# Patient Record
Sex: Male | Born: 1970 | Race: White | Hispanic: No | Marital: Single | Smoking: Never smoker
Health system: Southern US, Community
[De-identification: ages and names within clinical notes are randomized; demographics above are authoritative.]

## PROBLEM LIST (undated history)

## (undated) DIAGNOSIS — M1990 Unspecified osteoarthritis, unspecified site: Secondary | ICD-10-CM

## (undated) DIAGNOSIS — N200 Calculus of kidney: Secondary | ICD-10-CM

---

## 1999-03-30 ENCOUNTER — Ambulatory Visit (HOSPITAL_COMMUNITY): Admission: RE | Admit: 1999-03-30 | Discharge: 1999-03-30 | Payer: Self-pay | Admitting: Family Medicine

## 1999-03-30 ENCOUNTER — Encounter: Payer: Self-pay | Admitting: Family Medicine

## 1999-09-23 ENCOUNTER — Encounter: Admission: RE | Admit: 1999-09-23 | Discharge: 1999-09-23 | Payer: Self-pay | Admitting: Family Medicine

## 1999-09-23 ENCOUNTER — Encounter: Payer: Self-pay | Admitting: Family Medicine

## 2002-09-11 ENCOUNTER — Encounter: Admission: RE | Admit: 2002-09-11 | Discharge: 2002-09-11 | Payer: Self-pay | Admitting: *Deleted

## 2002-09-11 ENCOUNTER — Encounter: Payer: Self-pay | Admitting: *Deleted

## 2002-09-20 ENCOUNTER — Encounter: Admission: RE | Admit: 2002-09-20 | Discharge: 2002-09-20 | Payer: Self-pay | Admitting: *Deleted

## 2002-09-20 ENCOUNTER — Encounter: Payer: Self-pay | Admitting: *Deleted

## 2003-04-12 ENCOUNTER — Encounter: Payer: Self-pay | Admitting: Orthopedic Surgery

## 2003-04-12 ENCOUNTER — Encounter: Admission: RE | Admit: 2003-04-12 | Discharge: 2003-04-12 | Payer: Self-pay | Admitting: Orthopedic Surgery

## 2003-04-24 ENCOUNTER — Encounter: Payer: Self-pay | Admitting: Orthopedic Surgery

## 2003-04-24 ENCOUNTER — Encounter: Admission: RE | Admit: 2003-04-24 | Discharge: 2003-04-24 | Payer: Self-pay | Admitting: Orthopedic Surgery

## 2010-10-28 ENCOUNTER — Encounter
Admission: RE | Admit: 2010-10-28 | Discharge: 2010-10-28 | Payer: Self-pay | Source: Home / Self Care | Attending: Family Medicine | Admitting: Family Medicine

## 2013-05-11 ENCOUNTER — Encounter (HOSPITAL_COMMUNITY): Payer: Self-pay | Admitting: Emergency Medicine

## 2013-05-11 ENCOUNTER — Emergency Department (HOSPITAL_COMMUNITY): Payer: 59

## 2013-05-11 ENCOUNTER — Emergency Department (HOSPITAL_COMMUNITY)
Admission: EM | Admit: 2013-05-11 | Discharge: 2013-05-11 | Disposition: A | Discharge status: Home / Self Care | Payer: 59 | Attending: Emergency Medicine | Admitting: Emergency Medicine

## 2013-05-11 DIAGNOSIS — Z87442 Personal history of urinary calculi: Secondary | ICD-10-CM | POA: Insufficient documentation

## 2013-05-11 DIAGNOSIS — R3915 Urgency of urination: Secondary | ICD-10-CM | POA: Insufficient documentation

## 2013-05-11 DIAGNOSIS — Z8739 Personal history of other diseases of the musculoskeletal system and connective tissue: Secondary | ICD-10-CM | POA: Insufficient documentation

## 2013-05-11 DIAGNOSIS — I880 Nonspecific mesenteric lymphadenitis: Secondary | ICD-10-CM | POA: Insufficient documentation

## 2013-05-11 DIAGNOSIS — R509 Fever, unspecified: Secondary | ICD-10-CM | POA: Insufficient documentation

## 2013-05-11 DIAGNOSIS — N390 Urinary tract infection, site not specified: Secondary | ICD-10-CM | POA: Insufficient documentation

## 2013-05-11 HISTORY — DX: Calculus of kidney: N20.0

## 2013-05-11 HISTORY — DX: Unspecified osteoarthritis, unspecified site: M19.90

## 2013-05-11 LAB — COMPREHENSIVE METABOLIC PANEL
ALT: 66 U/L — ABNORMAL HIGH (ref 0–53)
Albumin: 4.3 g/dL (ref 3.5–5.2)
Alkaline Phosphatase: 75 U/L (ref 39–117)
Potassium: 3.8 mEq/L (ref 3.5–5.1)
Sodium: 134 mEq/L — ABNORMAL LOW (ref 135–145)
Total Protein: 8.2 g/dL (ref 6.0–8.3)

## 2013-05-11 LAB — CBC
MCHC: 36.2 g/dL — ABNORMAL HIGH (ref 30.0–36.0)
Platelets: 201 10*3/uL (ref 150–400)
RDW: 13.7 % (ref 11.5–15.5)

## 2013-05-11 LAB — URINALYSIS, ROUTINE W REFLEX MICROSCOPIC
Ketones, ur: 15 mg/dL — AB
Nitrite: POSITIVE — AB
Protein, ur: 30 mg/dL — AB
Urobilinogen, UA: 1 mg/dL (ref 0.0–1.0)
pH: 5.5 (ref 5.0–8.0)

## 2013-05-11 LAB — URINE MICROSCOPIC-ADD ON

## 2013-05-11 MED ORDER — HYDROMORPHONE HCL PF 1 MG/ML IJ SOLN
1.0000 mg | Freq: Once | INTRAMUSCULAR | Status: AC
  Administered 2013-05-11: 1 mg via INTRAVENOUS
  Filled 2013-05-11: qty 1

## 2013-05-11 MED ORDER — SODIUM CHLORIDE 0.9 % IV BOLUS (SEPSIS)
1000.0000 mL | Freq: Once | INTRAVENOUS | Status: AC
  Administered 2013-05-11: 1000 mL via INTRAVENOUS

## 2013-05-11 MED ORDER — OXYCODONE-ACETAMINOPHEN 5-325 MG PO TABS: 1.0000 | ORAL_TABLET | ORAL | Status: DC | PRN

## 2013-05-11 MED ORDER — MORPHINE SULFATE 4 MG/ML IJ SOLN
4.0000 mg | Freq: Once | INTRAMUSCULAR | Status: AC
  Administered 2013-05-11: 4 mg via INTRAVENOUS
  Filled 2013-05-11: qty 1

## 2013-05-11 MED ORDER — IOHEXOL 300 MG/ML  SOLN
100.0000 mL | Freq: Once | INTRAMUSCULAR | Status: AC | PRN
  Administered 2013-05-11: 100 mL via INTRAVENOUS

## 2013-05-11 MED ORDER — METRONIDAZOLE 500 MG PO TABS: 500.0000 mg | ORAL_TABLET | Freq: Two times a day (BID) | ORAL | Status: AC

## 2013-05-11 MED ORDER — ACETAMINOPHEN 325 MG PO TABS
650.0000 mg | ORAL_TABLET | Freq: Once | ORAL | Status: AC
  Administered 2013-05-11: 650 mg via ORAL
  Filled 2013-05-11: qty 2

## 2013-05-11 MED ORDER — CIPROFLOXACIN HCL 500 MG PO TABS: 500.0000 mg | ORAL_TABLET | Freq: Two times a day (BID) | ORAL | Status: AC

## 2013-05-11 MED ORDER — ONDANSETRON HCL 4 MG/2ML IJ SOLN
4.0000 mg | Freq: Once | INTRAMUSCULAR | Status: AC
  Administered 2013-05-11: 4 mg via INTRAVENOUS
  Filled 2013-05-11: qty 2

## 2013-05-11 MED ORDER — DEXTROSE 5 % IV SOLN
1.0000 g | Freq: Once | INTRAVENOUS | Status: AC
  Administered 2013-05-11: 1 g via INTRAVENOUS
  Filled 2013-05-11: qty 10

## 2013-05-11 MED ORDER — ONDANSETRON HCL 4 MG PO TABS: 4.0000 mg | ORAL_TABLET | Freq: Three times a day (TID) | ORAL | Status: AC | PRN

## 2013-05-11 NOTE — ED Provider Notes (Signed)
Patient presenting in atypical clinical picture for pyelonephritis. Urine sent for culture and sensitivity. In light of fever may have mesenteric adenitis,,DOUNBT LYMPHOMA. Plan followup with PMD .   Doug Sou, MD 05/12/13 1015

## 2013-05-11 NOTE — ED Provider Notes (Signed)
Complains of right-sided flank pain and right-sided abdominal pain onset last night. Maximum temperature 101.5 yesterday. No other complaint. On exam alert no distress lungs clear auscultation heart regular in rhythm abdomen mildly obese normal active bowel sounds tender at right upper and right lower quadrants genitalia normal male. Mild right flank tenderness present.  Doug Sou, MD 05/11/13 [phone]

## 2013-05-11 NOTE — ED Provider Notes (Signed)
History    CSN: [id number] Arrival date & time 05/11/13  1318  First MD Initiated Contact with Patient 05/11/13 1325     Chief Complaint  Patient presents with  . Flank Pain   (Consider location/radiation/quality/duration/timing/severity/associated sxs/prior Treatment) HPI  Thomas Stewart 42 year old male with a past medical history of kidney stents and psoriatic arthritis.  Patient is currently on methotrexate and Humira.  [facility] sent from [facility] chief complaint of fever and bilateral flank pain.  Patient states that he began having urinary frequency on Wednesday 3 days ago.  The next day he began having bilateral flank pain.  He was unable to sleep Thursday night or Friday night.  He had a fever of 102 orally at home.  Patient's wife states he also had active rigors.  He should states he thought it might be his kidney stones although his symptoms are different from previous and was hoping that he would pass the kidney stone.  He has been able to pass them previously.  Patient describes the pain as constant, achy, moderate.  He just denies any radiation.  He had some urgency of urination without complete emptying of the bladder today.  He denies any hematuria, dysuria. He has had nausea without vomiting. he was given Toradol the [facility] prior to arrival.  Past Medical History  Diagnosis Date  . Kidney stone   . Arthritis    History reviewed. No pertinent past surgical history. History reviewed. No pertinent family history. History  Substance Use Topics  . Smoking status: Never Smoker   . Smokeless tobacco: Not on file  . Alcohol Use: No    Review of Systems Ten systems reviewed and are negative for acute change, except as noted in the HPI.    Allergies  Review of patient's allergies indicates no known allergies.  Home Medications  No current outpatient prescriptions on file. BP 136/87  Pulse 103  Temp(Src) 98 F (36.7 C) (Oral)   Resp 18  SpO2 98% Physical Exam Physical Exam  Nursing note and vitals reviewed. Constitutional: He appears flushed and uncomfortable.  The patient has intermittent right upper quadrant abdominal pain.  This causes him to get up out of his bed and walk around the room with his hand on his abdomen.  He is short of breath during these periods of pain.  He describes it as spasm-like.   HENT:  Head: Normocephalic and atraumatic.  Eyes: Conjunctivae normal are normal. No scleral icterus.  Neck: Normal range of motion. Neck supple.  Cardiovascular: Normal rate, regular rhythm and normal heart sounds.   Pulmonary/Chest: Effort normal and breath sounds normal. No respiratory distress.  Abdominal: Soft. There is no tenderness. he is Exquisetly TTP in the RUQ. NO CVA tenderness. Musculoskeletal: He exhibits no edema.  Neurological: He is alert.  Skin: Skin is warm and dry. He is not diaphoretic.  Psychiatric: His behavior is normal.    ED Course  Procedures (including critical care time) Labs Reviewed  URINALYSIS, ROUTINE W REFLEX MICROSCOPIC  CBC  COMPREHENSIVE METABOLIC PANEL   No results found. No diagnosis found.  MDM  1:49 PM BP 136/87  Pulse 103  Temp(Src) 98 F (36.7 C) (Oral)  Resp 18  SpO2 98% Patient With symptoms of possible pyelonephritis vs. Biliary colic or cholangitis. Doubt kidney stone. Fluids, antiemetic and pain control initiated. Patient has had his RQU pain evaluated previously by [place] about 1 years ago and told   10:01 PM  BP 140/87  Pulse 116  Temp(Src) 100.2 F (37.9 C) (Oral)  Resp 22  SpO2 95% Patient with elevated taem. Tahycardia. Given tylenol and fluids. Pending normal vitals patient will be discharged with abx for mesenteric adenitis and UTI. Pain meds. Alternate tylenol and advil. F/u with PCP first thing Monday.  Arthor Captain, PA-C 05/11/13 2203

## 2013-05-11 NOTE — ED Notes (Signed)
Pt to ED with c/o bilateral flank pain, onset yesterday. Hx of kidneys stones. Pt send here from [facility] for CT scan.

## 2013-05-12 NOTE — ED Provider Notes (Signed)
Medical screening examination/treatment/procedure(s) were conducted as a shared visit with non-physician practitioner(s) and myself.  I personally evaluated the patient during the encounter  Kealy Lewter, MD 05/12/13 1016 

## 2014-09-29 ENCOUNTER — Other Ambulatory Visit: Payer: Self-pay | Admitting: Family Medicine

## 2014-09-29 DIAGNOSIS — R1084 Generalized abdominal pain: Secondary | ICD-10-CM

## 2014-09-30 ENCOUNTER — Ambulatory Visit
Admission: RE | Admit: 2014-09-30 | Discharge: 2014-09-30 | Disposition: A | Discharge status: Home / Self Care | Payer: PRIVATE HEALTH INSURANCE | Source: Ambulatory Visit | Attending: Family Medicine | Admitting: Family Medicine

## 2014-09-30 DIAGNOSIS — R1084 Generalized abdominal pain: Secondary | ICD-10-CM

## 2015-06-04 IMAGING — US US ABDOMEN COMPLETE
1 series · 14 of 25 positions shown · non-contrast
Comparison: Abdominal CT scan May 11, 2013 and abdominal
ultrasound October 28, 2010

CLINICAL DATA: Right upper quadrant pain for 1 week

EXAM:
ULTRASOUND ABDOMEN COMPLETE

[Series 1: us abdomen complete · 0.30mm/px · 14 of 67 slices shown]
[im 1/67]
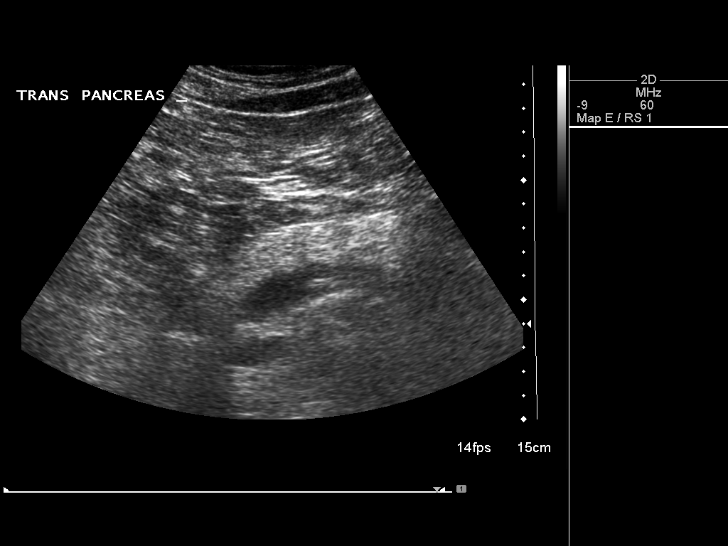
[im 6/67]
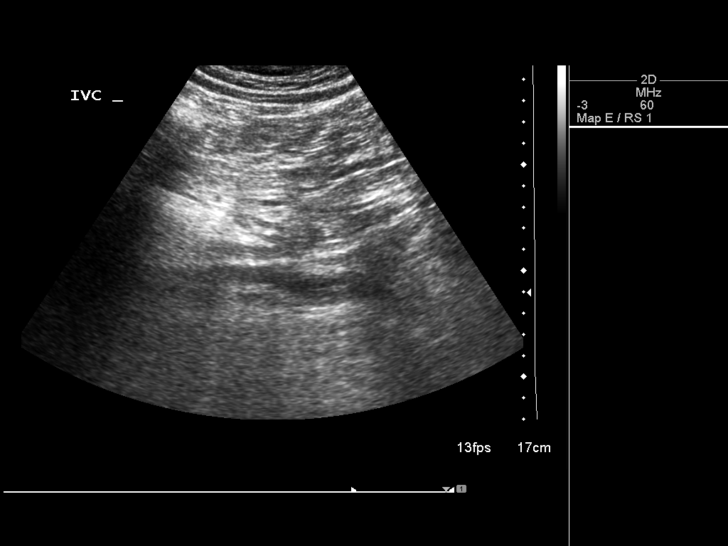
[im 12/67]
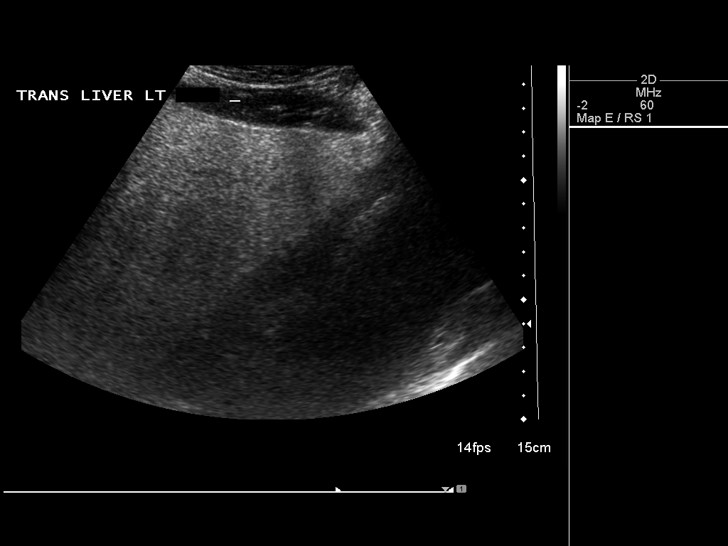
[im 17/67]
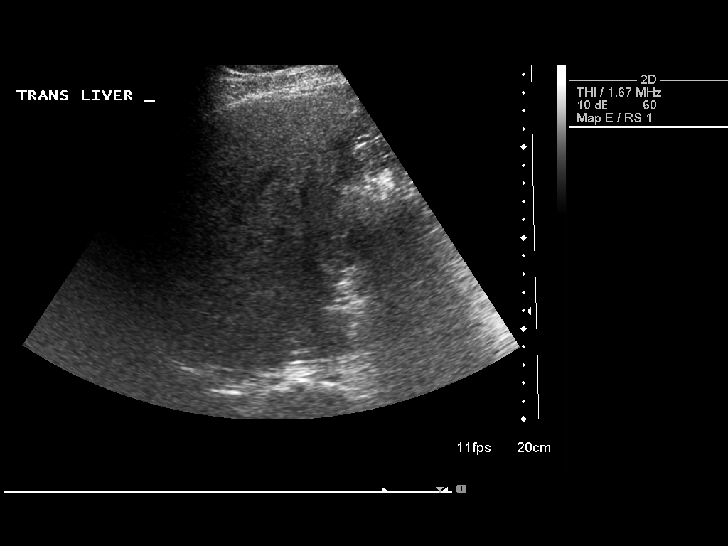
[im 23/67]
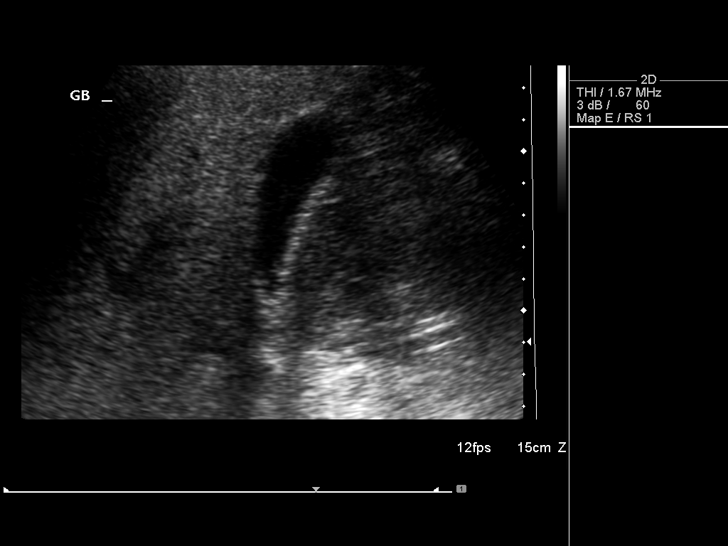
[im 25/67]
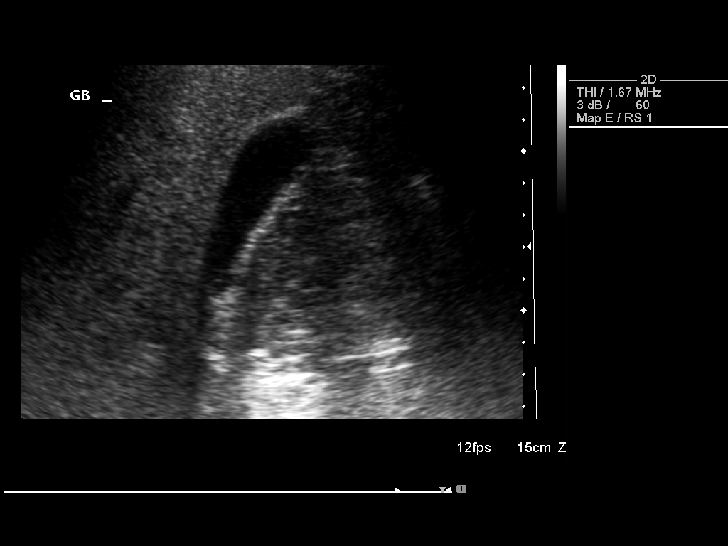
[im 31/67]
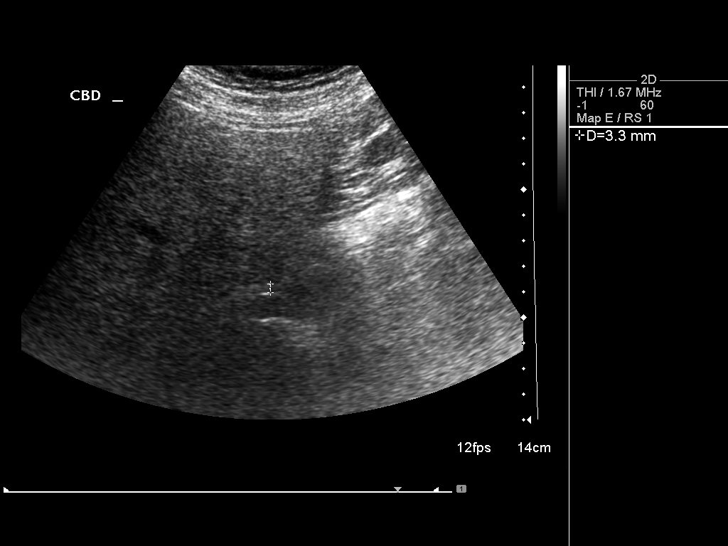
[im 36/67]
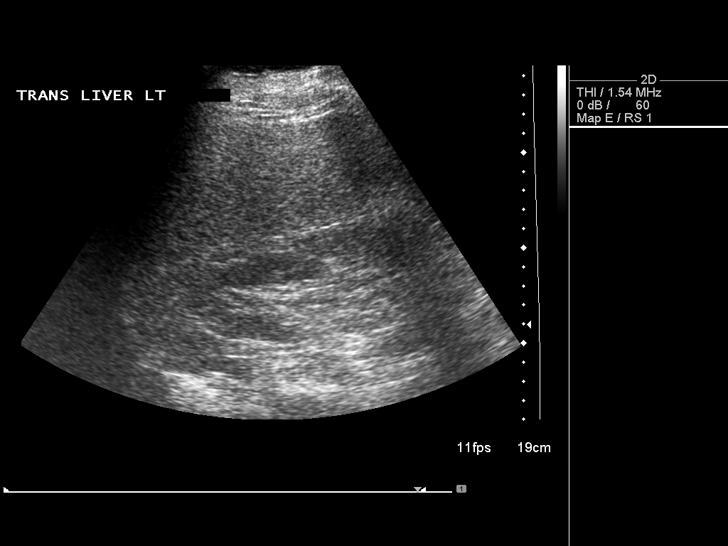
[im 42/67]
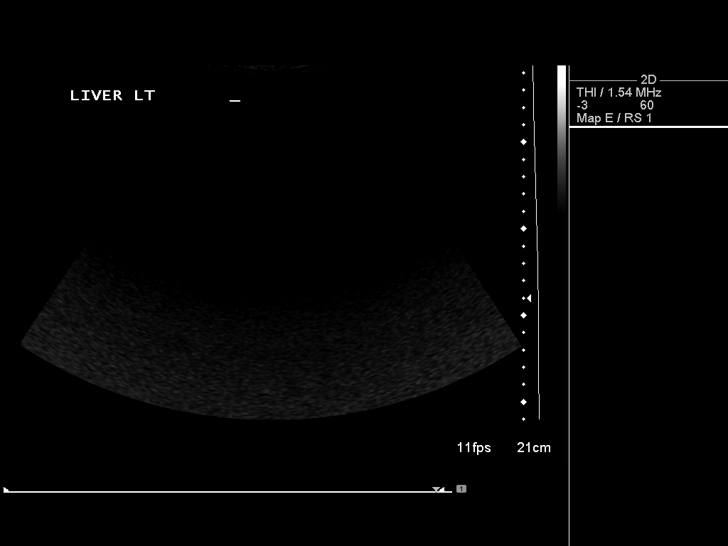
[im 45/67]
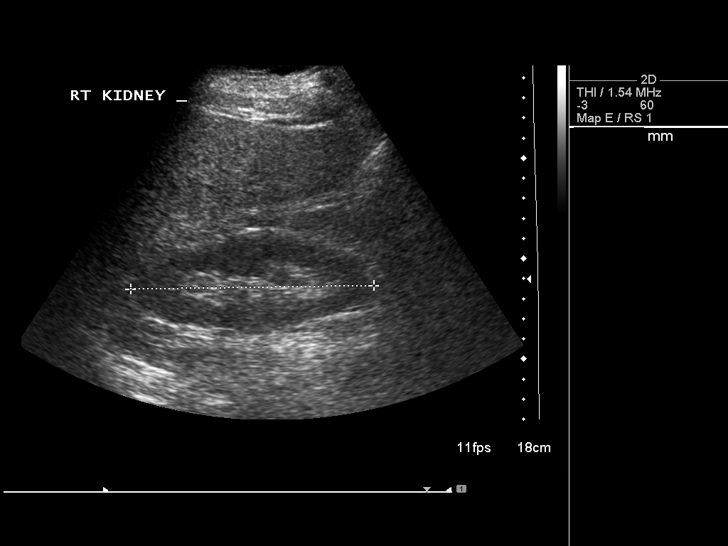
[im 50/67]
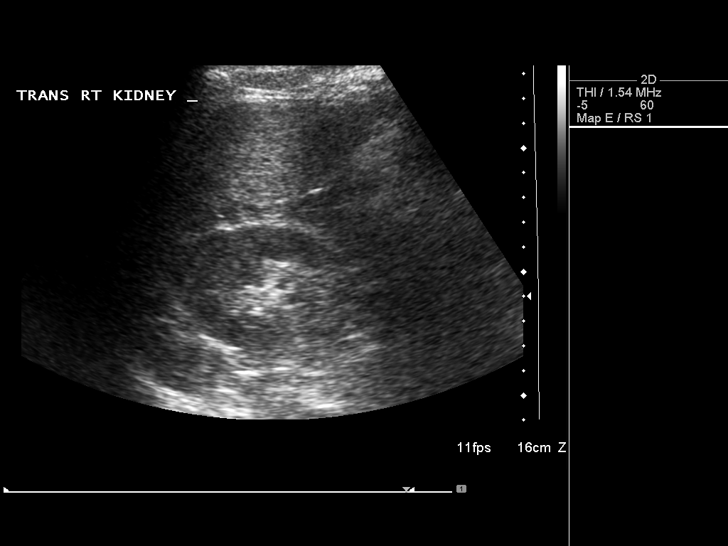
[im 56/67]
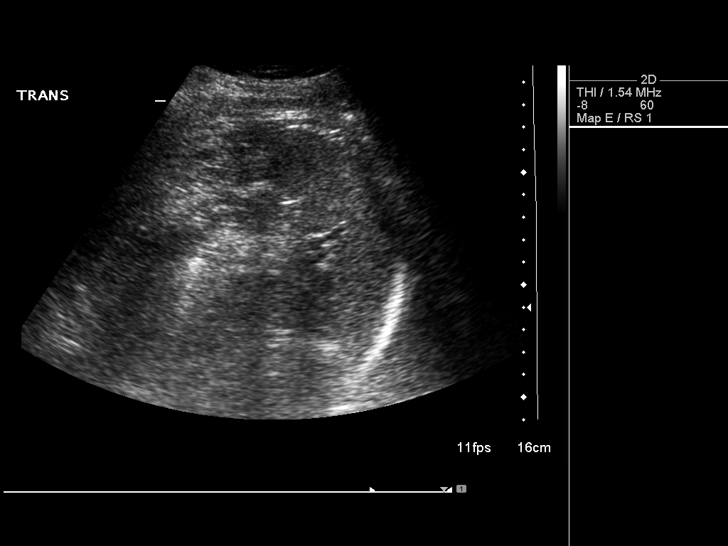
[im 61/67]
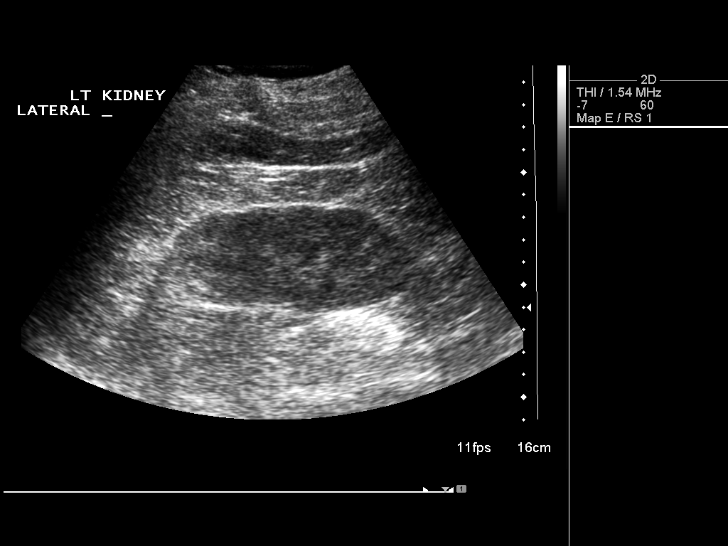
[im 67/67]
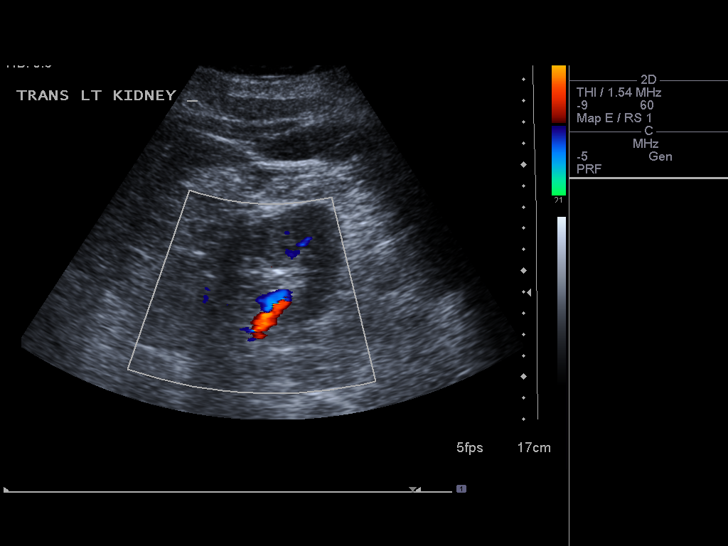

[14 of 25 positions shown; findings below may reference images not displayed]

FINDINGS: Gallbladder: No gallstones or wall thickening visualized. No
sonographic Murphy sign noted.

Common bile duct: Diameter: 3.8 mm

Liver: No focal lesion identified. Increased hepatic echotexture
diffusely. No intrahepatic ductal dilation is demonstrated.

IVC: No abnormality visualized.

Pancreas: Visualized portion unremarkable. The pancreatic tail was
obscured by bowel gas.

Spleen: Size and appearance within normal limits.

Right Kidney: Length: 12.1 cm. Echogenicity within normal limits. No
mass or hydronephrosis visualized.

Left Kidney: Length: 12.7 cm. Echogenicity within normal limits. No
mass or hydronephrosis visualized.

Abdominal aorta: No aneurysm visualized.

Other findings: No ascites is demonstrated.
IMPRESSION: There fatty infiltrative changes of the liver. No acute
intra-abdominal abnormality is demonstrated.

## 2017-06-26 ENCOUNTER — Emergency Department (HOSPITAL_COMMUNITY): Payer: PRIVATE HEALTH INSURANCE

## 2017-06-26 ENCOUNTER — Encounter (HOSPITAL_COMMUNITY): Payer: Self-pay | Admitting: Emergency Medicine

## 2017-06-26 ENCOUNTER — Emergency Department (HOSPITAL_COMMUNITY)
Admission: EM | Admit: 2017-06-26 | Discharge: 2017-06-26 | Disposition: A | Discharge status: Home / Self Care | Payer: PRIVATE HEALTH INSURANCE | Attending: Emergency Medicine | Admitting: Emergency Medicine

## 2017-06-26 DIAGNOSIS — Z79899 Other long term (current) drug therapy: Secondary | ICD-10-CM | POA: Insufficient documentation

## 2017-06-26 DIAGNOSIS — N23 Unspecified renal colic: Secondary | ICD-10-CM

## 2017-06-26 DIAGNOSIS — R319 Hematuria, unspecified: Secondary | ICD-10-CM | POA: Insufficient documentation

## 2017-06-26 DIAGNOSIS — Z7983 Long term (current) use of bisphosphonates: Secondary | ICD-10-CM | POA: Insufficient documentation

## 2017-06-26 DIAGNOSIS — R1084 Generalized abdominal pain: Secondary | ICD-10-CM | POA: Diagnosis present

## 2017-06-26 LAB — COMPREHENSIVE METABOLIC PANEL
ALK PHOS: 55 U/L (ref 38–126)
ALT: 50 U/L (ref 17–63)
AST: 33 U/L (ref 15–41)
Albumin: 4.1 g/dL (ref 3.5–5.0)
Anion gap: 6 (ref 5–15)
BILIRUBIN TOTAL: 0.9 mg/dL (ref 0.3–1.2)
BUN: 7 mg/dL (ref 6–20)
CO2: 27 mmol/L (ref 22–32)
CREATININE: 1.03 mg/dL (ref 0.61–1.24)
Calcium: 9 mg/dL (ref 8.9–10.3)
Chloride: 106 mmol/L (ref 101–111)
GFR calc Af Amer: >60 mL/min (ref >60)
Glucose, Bld: 93 mg/dL (ref 65–99)
Potassium: 3.9 mmol/L (ref 3.5–5.1)
Sodium: 139 mmol/L (ref 135–145)
TOTAL PROTEIN: 7.2 g/dL (ref 6.5–8.1)

## 2017-06-26 LAB — CBC WITH DIFFERENTIAL/PLATELET
BASOS ABS: 0 10*3/uL (ref 0.0–0.1)
BASOS PCT: 0 %
EOS PCT: 2 %
Eosinophils Absolute: 0.2 10*3/uL (ref 0.0–0.7)
HEMATOCRIT: 44.4 % (ref 39.0–52.0)
HEMOGLOBIN: 15.2 g/dL (ref 13.0–17.0)
LYMPHS PCT: 39 %
Lymphs Abs: 3.4 10*3/uL (ref 0.7–4.0)
MCH: 31.1 pg (ref 26.0–34.0)
MCHC: 34.2 g/dL (ref 30.0–36.0)
MCV: 90.8 fL (ref 78.0–100.0)
MONO ABS: 1 10*3/uL (ref 0.1–1.0)
Monocytes Relative: 11 %
Neutro Abs: 4.3 10*3/uL (ref 1.7–7.7)
Neutrophils Relative %: 48 %
Platelets: 240 10*3/uL (ref 150–400)
RBC: 4.89 MIL/uL (ref 4.22–5.81)
RDW: 13.4 % (ref 11.5–15.5)
WBC: 8.9 10*3/uL (ref 4.0–10.5)

## 2017-06-26 LAB — URINALYSIS, ROUTINE W REFLEX MICROSCOPIC
Bacteria, UA: NONE SEEN
Bilirubin Urine: NEGATIVE
Glucose, UA: NEGATIVE mg/dL
Ketones, ur: NEGATIVE mg/dL
LEUKOCYTES UA: NEGATIVE
Nitrite: NEGATIVE
PH: 8 (ref 5.0–8.0)
Protein, ur: 30 mg/dL — AB
SPECIFIC GRAVITY, URINE: 1.017 (ref 1.005–1.030)
SQUAMOUS EPITHELIAL / LPF: NONE SEEN

## 2017-06-26 LAB — LIPASE, BLOOD: LIPASE: 55 U/L — AB (ref 11–51)

## 2017-06-26 MED ORDER — ONDANSETRON HCL 4 MG/2ML IJ SOLN
4.0000 mg | Freq: Once | INTRAMUSCULAR | Status: AC
  Administered 2017-06-26: 4 mg via INTRAVENOUS
  Filled 2017-06-26: qty 2

## 2017-06-26 MED ORDER — SODIUM CHLORIDE 0.9 % IV SOLN
INTRAVENOUS | Status: DC
  Administered 2017-06-26: 12:00:00 via INTRAVENOUS

## 2017-06-26 MED ORDER — HYDROMORPHONE HCL 1 MG/ML IJ SOLN
0.5000 mg | INTRAMUSCULAR | Status: DC | PRN
  Administered 2017-06-26: 0.5 mg via INTRAVENOUS
  Filled 2017-06-26: qty 1

## 2017-06-26 MED ORDER — OXYCODONE-ACETAMINOPHEN 5-325 MG PO TABS: 1.0000 | ORAL_TABLET | Freq: Four times a day (QID) | ORAL | 0 refills | Status: AC | PRN

## 2017-06-26 MED ORDER — OXYCODONE-ACETAMINOPHEN 5-325 MG PO TABS
ORAL_TABLET | ORAL | Status: AC
  Filled 2017-06-26: qty 1

## 2017-06-26 MED ORDER — ONDANSETRON 8 MG PO TBDP: 8.0000 mg | ORAL_TABLET | Freq: Three times a day (TID) | ORAL | 0 refills | Status: AC | PRN

## 2017-06-26 MED ORDER — KETOROLAC TROMETHAMINE 30 MG/ML IJ SOLN
30.0000 mg | Freq: Once | INTRAMUSCULAR | Status: AC
  Administered 2017-06-26: 30 mg via INTRAVENOUS
  Filled 2017-06-26: qty 1

## 2017-06-26 MED ORDER — OXYCODONE-ACETAMINOPHEN 5-325 MG PO TABS
1.0000 | ORAL_TABLET | ORAL | Status: DC | PRN
  Administered 2017-06-26: 1 via ORAL

## 2017-06-26 MED ORDER — SODIUM CHLORIDE 0.9 % IV BOLUS (SEPSIS)
1000.0000 mL | Freq: Once | INTRAVENOUS | Status: AC
  Administered 2017-06-26: 1000 mL via INTRAVENOUS

## 2017-06-26 MED ORDER — NAPROXEN 500 MG PO TABS: 500.0000 mg | ORAL_TABLET | Freq: Two times a day (BID) | ORAL | 0 refills | Status: AC

## 2017-06-26 NOTE — Discharge Instructions (Signed)
Use a urine strainer, Drink plenty of fluids, follow up with urology as we discussed

## 2017-06-26 NOTE — ED Triage Notes (Signed)
Pt sts abd and flank pain starting last night with hematuria; pt sts feels similar to when had kidney stones in past

## 2017-06-26 NOTE — ED Provider Notes (Addendum)
[facility]-EMERGENCY DEPT Provider Note   CSN: [id number] Arrival date & time: 06/26/17  4098     History   Chief Complaint Chief Complaint  Patient presents with  . Abdominal Pain  . Hematuria    HPI Thomas Stewart is a 46 y.o. male.  HPI Patient presents to the emergency room for evaluation of flank pain that started last night. Patient also noticed blood in his urine.  Patient states the symptoms have worsened since yesterday. The pain is also moved towards the suprapubic region. He's also noted gross blood in his urine.  He denies any trouble with fever. No vomiting or diarrhea. No nausea.  Patient does have history of kidney stones in the past but this does not feel exactly similar. The patient is different. Past Medical History:  Diagnosis Date  . Arthritis   . Kidney stone     There are no active problems to display for this patient.   History reviewed. No pertinent surgical history.     Home Medications    Prior to Admission medications   Medication Sig Start Date End Date Taking? Authorizing Provider  acetaminophen (TYLENOL) 325 MG tablet Take 650 mg by mouth every 6 (six) hours as needed for pain (fever).    [provider]  adalimumab (HUMIRA) 40 MG/0.8ML injection Inject 40 mg into the skin every 14 (fourteen) days.    [provider]  cetirizine (ZYRTEC) 10 MG tablet Take 10 mg by mouth daily as needed for allergies.    [provider]  ciprofloxacin (CIPRO) 500 MG tablet Take 1 tablet (500 mg total) by mouth 2 (two) times daily. One po bid x 7 days 05/11/13   Arthor Captain, PA-C  folic acid (FOLVITE) 1 MG tablet Take 1 mg by mouth daily.    [provider]  methotrexate (RHEUMATREX) 2.5 MG tablet Take 15 mg by mouth once a week. Caution:Chemotherapy. Protect from light.    [provider]  metroNIDAZOLE (FLAGYL) 500 MG tablet Take 1 tablet (500 mg total) by mouth 2 (two) times daily. One po bid x 7 days  05/11/13   Arthor Captain, PA-C  naproxen (NAPROSYN) 500 MG tablet Take 1 tablet (500 mg total) by mouth 2 (two) times daily. 06/26/17   Linwood Dibbles, MD  omeprazole (PRILOSEC) 20 MG capsule Take 20 mg by mouth daily.    [provider]  ondansetron (ZOFRAN ODT) 8 MG disintegrating tablet Take 1 tablet (8 mg total) by mouth every 8 (eight) hours as needed for nausea or vomiting. 06/26/17   Linwood Dibbles, MD  ondansetron (ZOFRAN) 4 MG tablet Take 1 tablet (4 mg total) by mouth every 8 (eight) hours as needed for nausea. 05/11/13   Arthor Captain, PA-C  oxyCODONE-acetaminophen (PERCOCET) 5-325 MG tablet Take 1 tablet by mouth every 6 (six) hours as needed. 06/26/17   Linwood Dibbles, MD    Family History History reviewed. No pertinent family history.  Social History Social History  Substance Use Topics  . Smoking status: Never Smoker  . Smokeless tobacco: Not on file  . Alcohol use No     Allergies   Patient has no known allergies.   Review of Systems Review of Systems  All other systems reviewed and are negative.    Physical Exam Updated Vital Signs BP (!) 135/91   Pulse 80   Temp 98.1 F (36.7 C) (Oral)   Resp 18   Ht 1.803 m (5\' 11" )   Wt 104.3 kg (230 lb)  SpO2 97%   BMI 32.08 kg/m   Physical Exam  Constitutional: He appears well-developed and well-nourished. No distress.  HENT:  Head: Normocephalic and atraumatic.  Right Ear: External ear normal.  Left Ear: External ear normal.  Eyes: Conjunctivae are normal. Right eye exhibits no discharge. Left eye exhibits no discharge. No scleral icterus.  Neck: Neck supple. No tracheal deviation present.  Cardiovascular: Normal rate and regular rhythm.   Pulmonary/Chest: Effort normal. No stridor. No respiratory distress.  Abdominal: Soft. He exhibits no distension and no mass. There is no guarding.  Musculoskeletal: He exhibits no edema.  Neurological: He is alert. Cranial nerve deficit: no gross deficits.  Skin: Skin is  warm and dry. No rash noted.  Psychiatric: He has a normal mood and affect.  Nursing note and vitals reviewed.    ED Treatments / Results  Labs (all labs ordered are listed, but only abnormal results are displayed) Labs Reviewed  URINALYSIS, ROUTINE W REFLEX MICROSCOPIC - Abnormal; Notable for the following:       Result Value   APPearance HAZY (*)    Hgb urine dipstick LARGE (*)    Protein, ur 30 (*)    All other components within normal limits  LIPASE, BLOOD - Abnormal; Notable for the following:    Lipase 55 (*)    All other components within normal limits  COMPREHENSIVE METABOLIC PANEL  CBC WITH DIFFERENTIAL/PLATELET     Radiology Dg Abdomen 1 View  Result Date: 06/26/2017 CLINICAL DATA:  Flank and groin pain for 3 days, history kidney stones EXAM: ABDOMEN - 1 VIEW COMPARISON:  CT abdomen and pelvis 05/11/2013 FINDINGS: Tiny nonobstructing calculus upper pole LEFT kidney unchanged. No additional urinary tract calcifications. Bowel gas pattern normal. Bones unremarkable. IMPRESSION: Tiny nonobstructing upper pole LEFT renal calculus. Electronically Signed   By: Ulyses SouthwardMark  Boles M.D.   On: 06/26/2017 10:51    Procedures Procedures (including critical care time)  Medications Ordered in ED Medications  oxyCODONE-acetaminophen (PERCOCET/ROXICET) 5-325 MG per tablet 1 tablet (1 tablet Oral Given 06/26/17 0842)  oxyCODONE-acetaminophen (PERCOCET/ROXICET) 5-325 MG per tablet (not administered)  sodium chloride 0.9 % bolus 1,000 mL (0 mLs Intravenous Stopped 06/26/17 1259)    And  0.9 %  sodium chloride infusion ( Intravenous New Bag/Given 06/26/17 1158)  HYDROmorphone (DILAUDID) injection 0.5 mg (0.5 mg Intravenous Given 06/26/17 1157)  ketorolac (TORADOL) 30 MG/ML injection 30 mg (not administered)  ondansetron (ZOFRAN) injection 4 mg (4 mg Intravenous Given 06/26/17 1157)     Initial Impression / Assessment and Plan / ED Course  I have reviewed the triage vital signs and the  nursing notes.  Pertinent labs & imaging results that were available during my care of the patient were reviewed by me and considered in my medical decision making (see chart for details).   patient presented to the emergency room with complaints of flank pain and hematuria. Patient has a history of kidney stones. He was treated empirically for recurrent renal colic. Symptoms improved.  We discussed outpatient follow-up with urology. We'll discharge home with urine strainers and pain medications.  Final Clinical Impressions(s) / ED Diagnoses   Final diagnoses:  Renal colic    New Prescriptions New Prescriptions   NAPROXEN (NAPROSYN) 500 MG TABLET    Take 1 tablet (500 mg total) by mouth 2 (two) times daily.   ONDANSETRON (ZOFRAN ODT) 8 MG DISINTEGRATING TABLET    Take 1 tablet (8 mg total) by mouth every 8 (eight) hours as needed for  nausea or vomiting.   OXYCODONE-ACETAMINOPHEN (PERCOCET) 5-325 MG TABLET    Take 1 tablet by mouth every 6 (six) hours as needed.         Linwood Dibbles, MD 06/26/17 [phone]

## 2020-09-24 ENCOUNTER — Ambulatory Visit
Admission: RE | Admit: 2020-09-24 | Discharge: 2020-09-24 | Disposition: A | Discharge status: Home / Self Care | Payer: PRIVATE HEALTH INSURANCE | Source: Ambulatory Visit | Attending: Family Medicine | Admitting: Family Medicine

## 2020-09-24 ENCOUNTER — Other Ambulatory Visit: Payer: Self-pay | Admitting: Family Medicine

## 2020-09-24 DIAGNOSIS — J40 Bronchitis, not specified as acute or chronic: Secondary | ICD-10-CM

## 2021-05-29 IMAGING — CR DG CHEST 2V
2 series · 2 of 2 positions shown · non-contrast
Comparison: None.

CLINICAL DATA: Cough, short of breath, bronchitis

EXAM:
CHEST - 2 VIEW

[w chest pa]
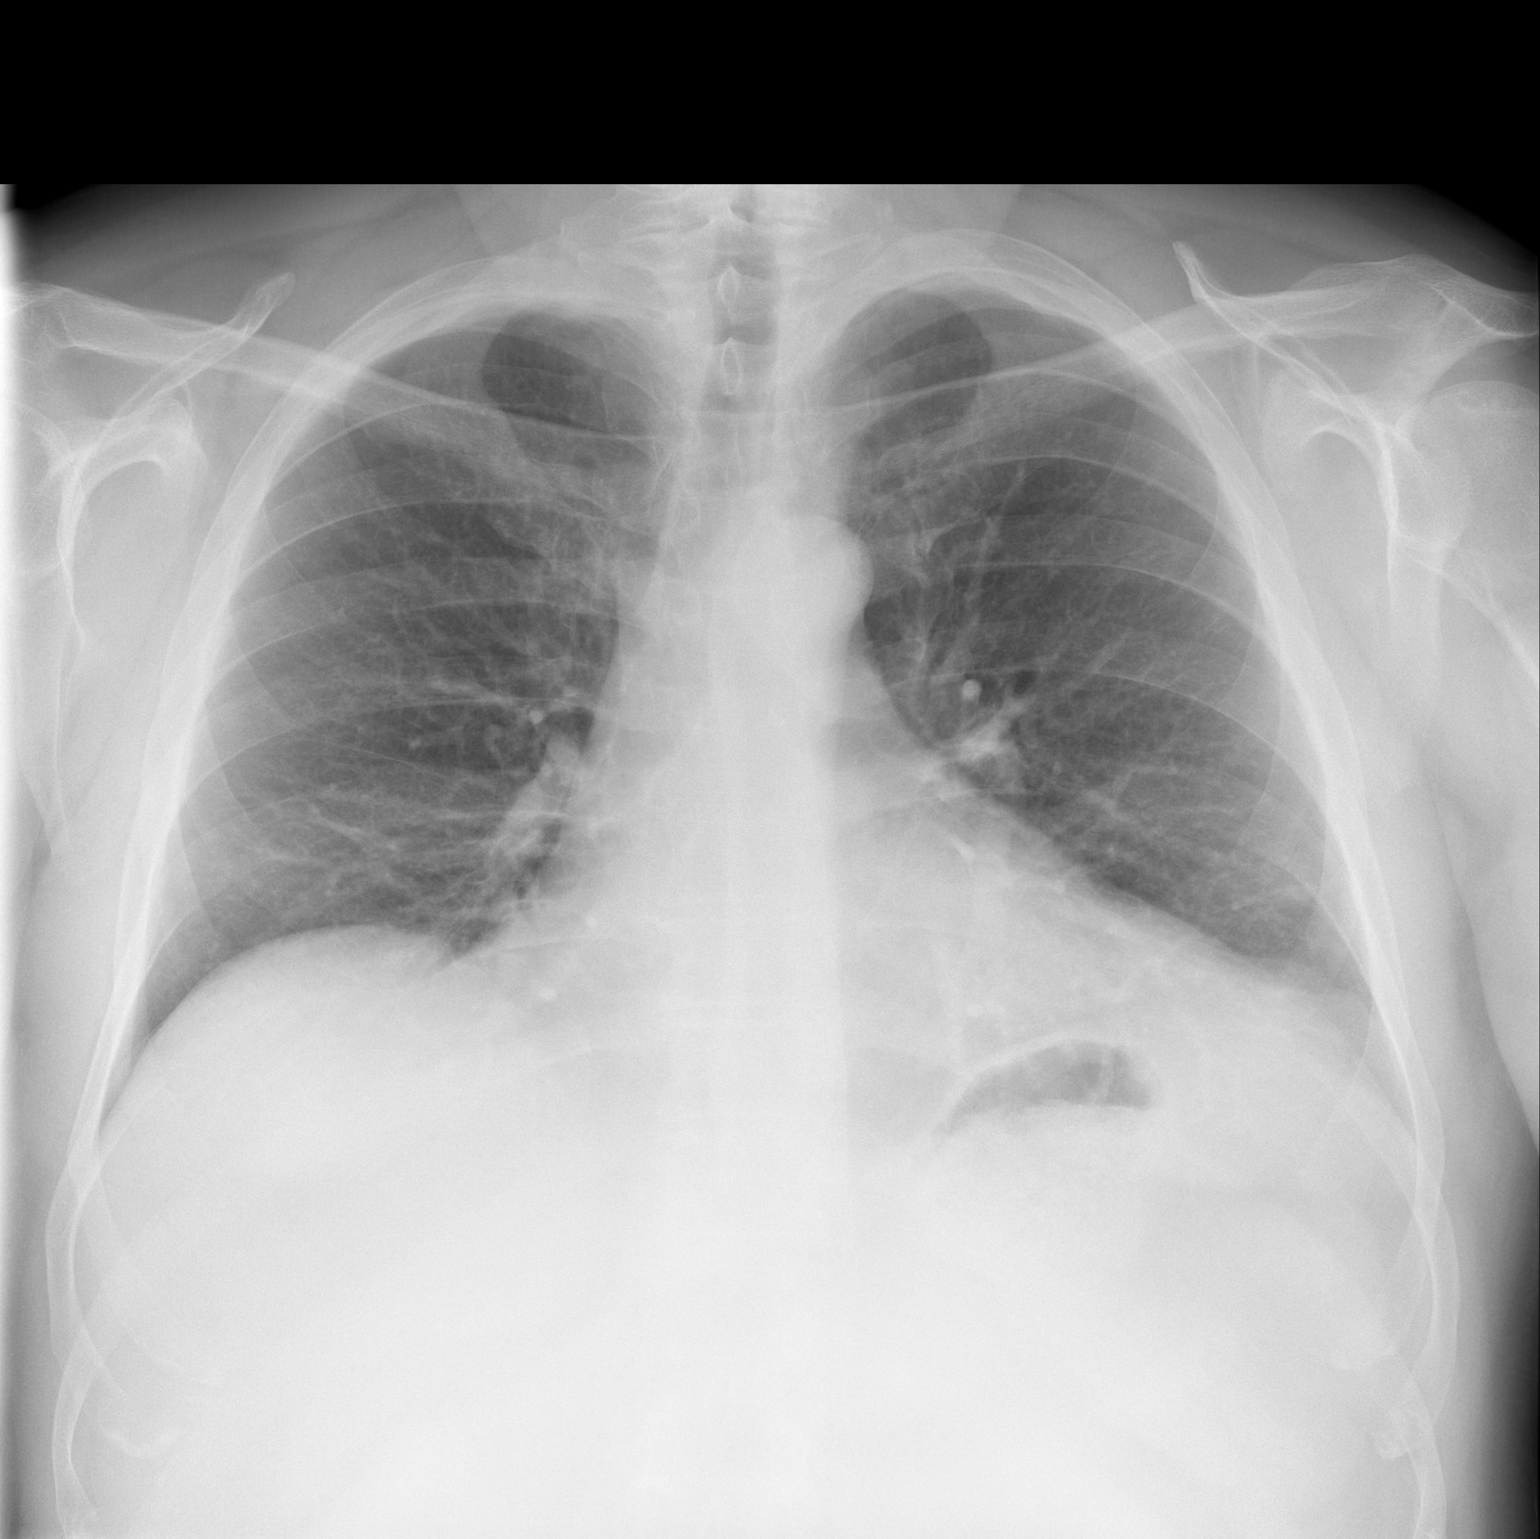

[w chest lat]
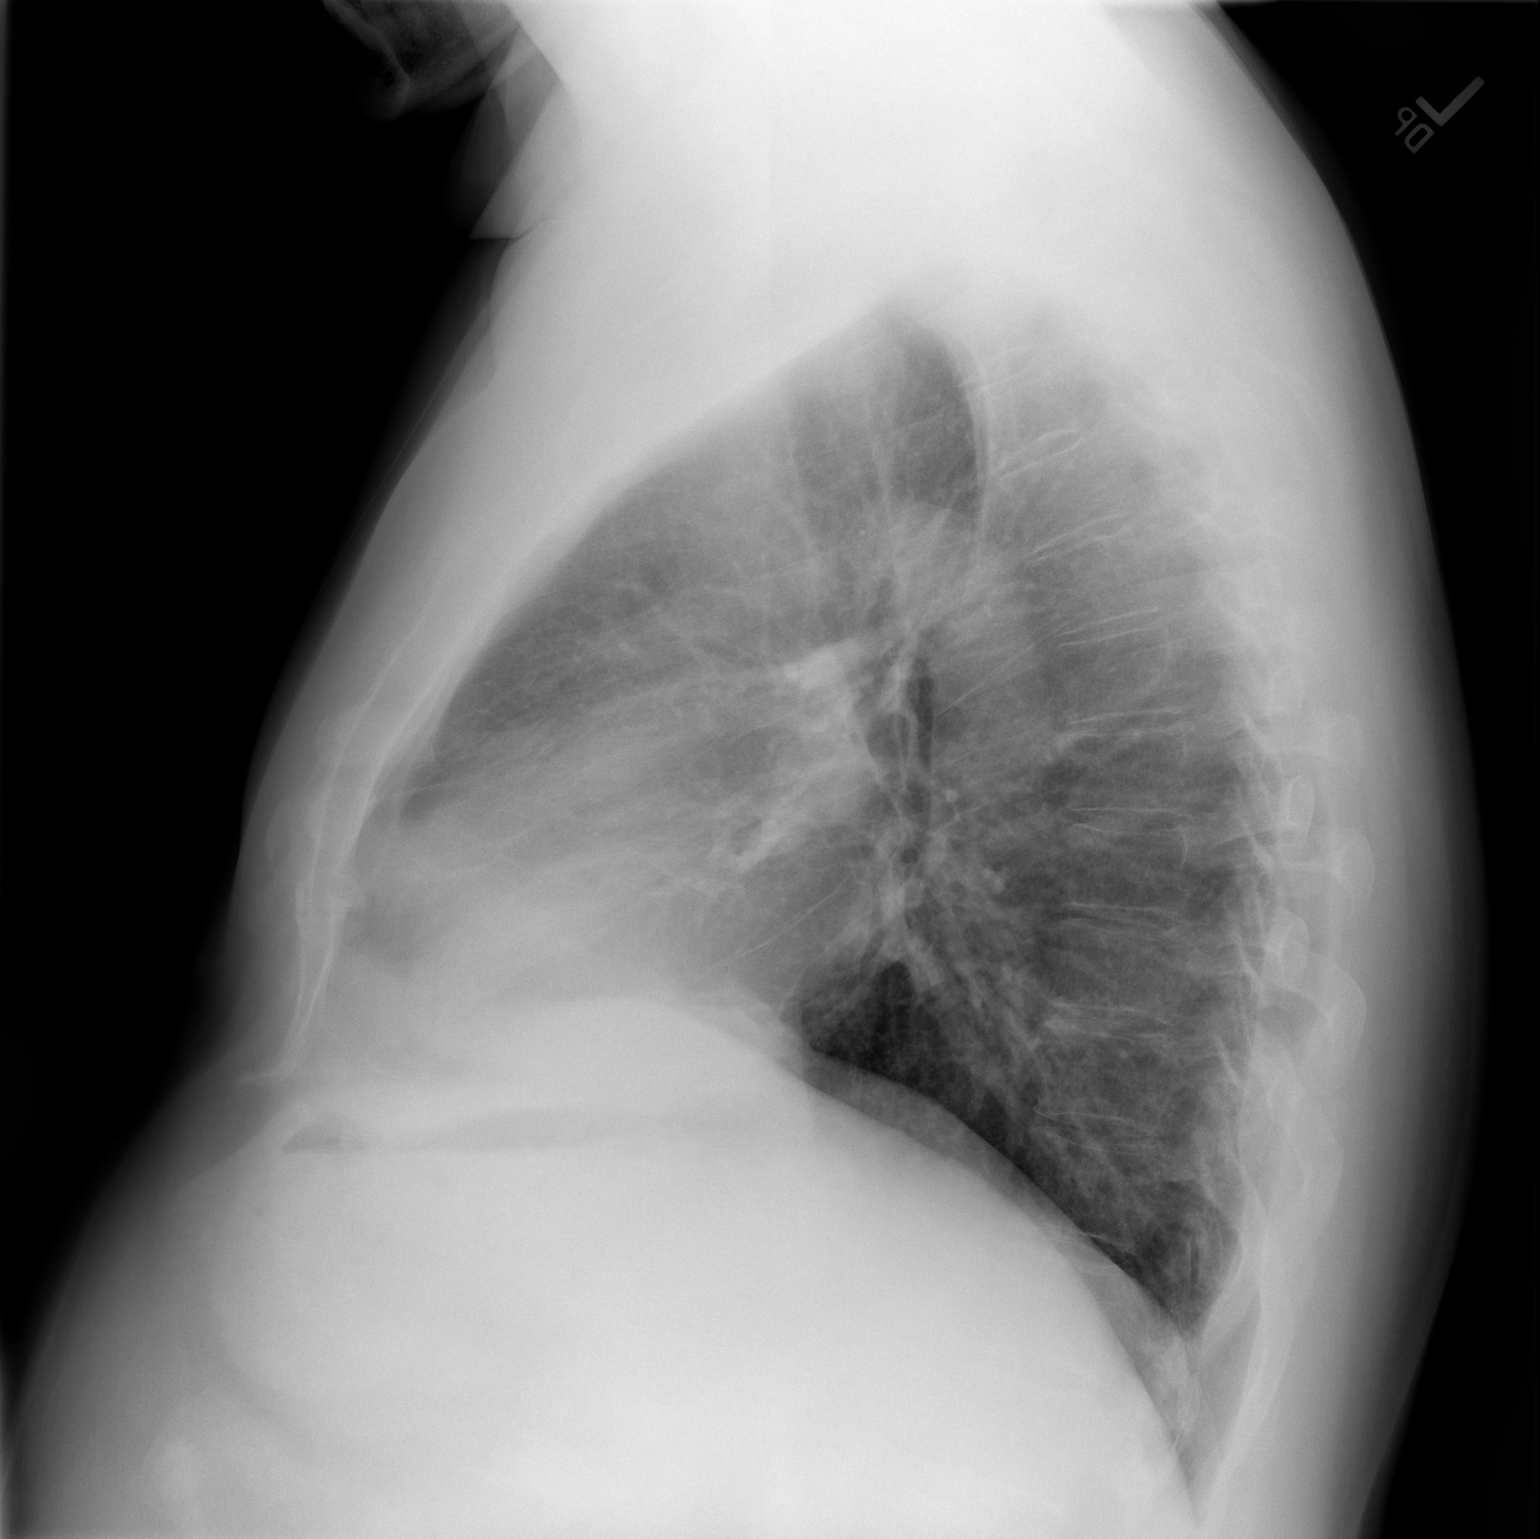

[2 of 2 positions shown; findings below may reference images not displayed]

FINDINGS: The heart size and mediastinal contours are within normal limits.
Both lungs are clear. The visualized skeletal structures are
unremarkable.
IMPRESSION: No active cardiopulmonary disease.

## 2021-06-14 ENCOUNTER — Other Ambulatory Visit (HOSPITAL_COMMUNITY): Payer: Self-pay

## 2021-06-14 MED ORDER — MOLNUPIRAVIR 200 MG PO CAPS
800.0000 mg | ORAL_CAPSULE | Freq: Two times a day (BID) | ORAL | 0 refills | Status: AC
  Filled 2021-06-14: qty 40, 5d supply, fill #0

## 2023-11-02 ENCOUNTER — Other Ambulatory Visit (HOSPITAL_BASED_OUTPATIENT_CLINIC_OR_DEPARTMENT_OTHER): Payer: Self-pay

## 2023-11-02 MED ORDER — PAXLOVID (300/100) 20 X 150 MG & 10 X 100MG PO TBPK
3.0000 | ORAL_TABLET | Freq: Two times a day (BID) | ORAL | 0 refills | Status: AC
  Filled 2023-11-02: qty 30, 5d supply, fill #0
